# Patient Record
Sex: Female | Born: 1950 | Race: Black or African American | Hispanic: No | Marital: Single | Smoking: Former smoker
Health system: Southern US, Community
[De-identification: ages and names within clinical notes are randomized; demographics above are authoritative.]

## PROBLEM LIST (undated history)

## (undated) DIAGNOSIS — D509 Iron deficiency anemia, unspecified: Secondary | ICD-10-CM

## (undated) DIAGNOSIS — E7800 Pure hypercholesterolemia, unspecified: Secondary | ICD-10-CM

## (undated) HISTORY — PX: APPENDECTOMY: SHX54

## (undated) HISTORY — DX: Pure hypercholesterolemia, unspecified: E78.00

## (undated) HISTORY — PX: BREAST SURGERY: SHX581

## (undated) HISTORY — PX: ABDOMINAL HYSTERECTOMY: SHX81

---

## 2000-01-13 ENCOUNTER — Other Ambulatory Visit: Admission: RE | Admit: 2000-01-13 | Discharge: 2000-01-13 | Payer: Self-pay | Admitting: Internal Medicine

## 2016-03-02 ENCOUNTER — Other Ambulatory Visit: Payer: Self-pay | Admitting: Family Medicine

## 2016-03-02 DIAGNOSIS — Z1231 Encounter for screening mammogram for malignant neoplasm of breast: Secondary | ICD-10-CM

## 2016-03-17 ENCOUNTER — Ambulatory Visit
Admission: RE | Admit: 2016-03-17 | Discharge: 2016-03-17 | Disposition: A | Discharge status: Home / Self Care | Payer: Medicare Other | Source: Ambulatory Visit | Attending: Family Medicine | Admitting: Family Medicine

## 2016-03-17 DIAGNOSIS — Z1231 Encounter for screening mammogram for malignant neoplasm of breast: Secondary | ICD-10-CM

## 2017-03-09 ENCOUNTER — Other Ambulatory Visit: Payer: Self-pay | Admitting: Family Medicine

## 2017-03-09 DIAGNOSIS — E2839 Other primary ovarian failure: Secondary | ICD-10-CM

## 2017-03-09 DIAGNOSIS — Z1231 Encounter for screening mammogram for malignant neoplasm of breast: Secondary | ICD-10-CM

## 2017-03-31 ENCOUNTER — Ambulatory Visit
Admission: RE | Admit: 2017-03-31 | Discharge: 2017-03-31 | Disposition: A | Discharge status: Home / Self Care | Payer: Medicare Other | Source: Ambulatory Visit | Attending: Family Medicine | Admitting: Family Medicine

## 2017-03-31 DIAGNOSIS — E2839 Other primary ovarian failure: Secondary | ICD-10-CM

## 2017-04-05 ENCOUNTER — Other Ambulatory Visit: Payer: Self-pay | Admitting: Family Medicine

## 2017-04-05 DIAGNOSIS — E2839 Other primary ovarian failure: Secondary | ICD-10-CM

## 2018-01-17 ENCOUNTER — Encounter (HOSPITAL_COMMUNITY): Payer: Self-pay | Admitting: Student

## 2018-01-17 DIAGNOSIS — M542 Cervicalgia: Secondary | ICD-10-CM | POA: Insufficient documentation

## 2018-01-17 DIAGNOSIS — M545 Low back pain: Secondary | ICD-10-CM | POA: Diagnosis present

## 2018-01-17 DIAGNOSIS — Z5321 Procedure and treatment not carried out due to patient leaving prior to being seen by health care provider: Secondary | ICD-10-CM | POA: Diagnosis not present

## 2018-01-17 DIAGNOSIS — R51 Headache: Secondary | ICD-10-CM | POA: Diagnosis not present
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
## 2018-01-18 ENCOUNTER — Emergency Department (HOSPITAL_COMMUNITY)
Admission: EM | Admit: 2018-01-18 | Discharge: 2018-01-18 | Disposition: A | Discharge status: Home / Self Care | Payer: No Typology Code available for payment source | Attending: Emergency Medicine | Admitting: Emergency Medicine

## 2018-01-18 ENCOUNTER — Emergency Department (HOSPITAL_COMMUNITY)
Admission: EM | Admit: 2018-01-18 | Discharge: 2018-01-18 | Disposition: A | Discharge status: Home / Self Care | Payer: No Typology Code available for payment source | Source: Home / Self Care | Attending: Emergency Medicine | Admitting: Emergency Medicine

## 2018-01-18 ENCOUNTER — Encounter (HOSPITAL_COMMUNITY): Payer: Self-pay | Admitting: Emergency Medicine

## 2018-01-18 ENCOUNTER — Emergency Department (HOSPITAL_COMMUNITY)
Admission: EM | Admit: 2018-01-18 | Discharge: 2018-01-18 | Discharge status: Absent without leave | Payer: Medicare Other

## 2018-01-18 DIAGNOSIS — F17210 Nicotine dependence, cigarettes, uncomplicated: Secondary | ICD-10-CM | POA: Insufficient documentation

## 2018-01-18 DIAGNOSIS — M542 Cervicalgia: Secondary | ICD-10-CM | POA: Insufficient documentation

## 2018-01-18 DIAGNOSIS — M545 Low back pain: Secondary | ICD-10-CM

## 2018-01-18 MED ORDER — CYCLOBENZAPRINE HCL 10 MG PO TABS: 10.0000 mg | ORAL_TABLET | Freq: Two times a day (BID) | ORAL | 0 refills | Status: DC | PRN

## 2018-01-18 MED ORDER — IBUPROFEN 600 MG PO TABS: 600.0000 mg | ORAL_TABLET | Freq: Four times a day (QID) | ORAL | 0 refills | Status: DC | PRN
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
## 2018-01-18 NOTE — ED Notes (Signed)
Bed: WTR5 Expected date:  Expected time:  Means of arrival:  Comments: 

## 2018-01-18 NOTE — ED Notes (Addendum)
Pt. Called to room but no answer x 2.

## 2018-05-09 ENCOUNTER — Other Ambulatory Visit: Payer: Self-pay | Admitting: Family Medicine

## 2018-05-09 DIAGNOSIS — Z1231 Encounter for screening mammogram for malignant neoplasm of breast: Secondary | ICD-10-CM

## 2020-04-16 ENCOUNTER — Other Ambulatory Visit: Payer: Self-pay | Admitting: Family Medicine

## 2020-04-16 DIAGNOSIS — Z1231 Encounter for screening mammogram for malignant neoplasm of breast: Secondary | ICD-10-CM

## 2020-05-08 ENCOUNTER — Other Ambulatory Visit: Payer: Self-pay

## 2020-05-08 ENCOUNTER — Ambulatory Visit
Admission: RE | Admit: 2020-05-08 | Discharge: 2020-05-08 | Disposition: A | Discharge status: Home / Self Care | Payer: Medicare Other | Source: Ambulatory Visit | Attending: Family Medicine | Admitting: Family Medicine

## 2020-05-08 DIAGNOSIS — Z1231 Encounter for screening mammogram for malignant neoplasm of breast: Secondary | ICD-10-CM

## 2021-05-11 ENCOUNTER — Other Ambulatory Visit: Payer: Self-pay

## 2021-05-11 ENCOUNTER — Other Ambulatory Visit: Payer: Self-pay | Admitting: Family Medicine

## 2021-05-11 ENCOUNTER — Ambulatory Visit
Admission: RE | Admit: 2021-05-11 | Discharge: 2021-05-11 | Disposition: A | Discharge status: Home / Self Care | Payer: Medicare Other | Source: Ambulatory Visit | Attending: Family Medicine | Admitting: Family Medicine

## 2021-05-11 DIAGNOSIS — Z1231 Encounter for screening mammogram for malignant neoplasm of breast: Secondary | ICD-10-CM

## 2021-05-19 DIAGNOSIS — F17201 Nicotine dependence, unspecified, in remission: Secondary | ICD-10-CM | POA: Diagnosis not present

## 2021-05-19 DIAGNOSIS — Z0000 Encounter for general adult medical examination without abnormal findings: Secondary | ICD-10-CM | POA: Diagnosis not present

## 2021-05-19 DIAGNOSIS — Z13220 Encounter for screening for lipoid disorders: Secondary | ICD-10-CM | POA: Diagnosis not present

## 2021-05-19 DIAGNOSIS — D509 Iron deficiency anemia, unspecified: Secondary | ICD-10-CM | POA: Diagnosis not present

## 2021-05-19 DIAGNOSIS — Z1389 Encounter for screening for other disorder: Secondary | ICD-10-CM | POA: Diagnosis not present

## 2021-05-19 DIAGNOSIS — M8588 Other specified disorders of bone density and structure, other site: Secondary | ICD-10-CM | POA: Diagnosis not present

## 2021-05-19 DIAGNOSIS — Z136 Encounter for screening for cardiovascular disorders: Secondary | ICD-10-CM | POA: Diagnosis not present

## 2021-05-19 DIAGNOSIS — E782 Mixed hyperlipidemia: Secondary | ICD-10-CM | POA: Diagnosis not present

## 2021-07-07 DIAGNOSIS — Z780 Asymptomatic menopausal state: Secondary | ICD-10-CM | POA: Diagnosis not present

## 2021-07-19 ENCOUNTER — Emergency Department (HOSPITAL_COMMUNITY): Payer: Medicare Other

## 2021-07-19 ENCOUNTER — Other Ambulatory Visit: Payer: Self-pay

## 2021-07-19 ENCOUNTER — Emergency Department (HOSPITAL_COMMUNITY)
Admission: EM | Admit: 2021-07-19 | Discharge: 2021-07-20 | Disposition: A | Discharge status: Home / Self Care | Payer: Medicare Other | Attending: Emergency Medicine | Admitting: Emergency Medicine

## 2021-07-19 ENCOUNTER — Encounter (HOSPITAL_COMMUNITY): Payer: Self-pay

## 2021-07-19 DIAGNOSIS — Z041 Encounter for examination and observation following transport accident: Secondary | ICD-10-CM | POA: Diagnosis not present

## 2021-07-19 DIAGNOSIS — I700 Atherosclerosis of aorta: Secondary | ICD-10-CM | POA: Diagnosis not present

## 2021-07-19 DIAGNOSIS — M545 Low back pain, unspecified: Secondary | ICD-10-CM | POA: Diagnosis not present

## 2021-07-19 DIAGNOSIS — R600 Localized edema: Secondary | ICD-10-CM | POA: Diagnosis not present

## 2021-07-19 DIAGNOSIS — Z87891 Personal history of nicotine dependence: Secondary | ICD-10-CM | POA: Diagnosis not present

## 2021-07-19 DIAGNOSIS — M25562 Pain in left knee: Secondary | ICD-10-CM | POA: Insufficient documentation

## 2021-07-19 DIAGNOSIS — S0990XA Unspecified injury of head, initial encounter: Secondary | ICD-10-CM | POA: Diagnosis not present

## 2021-07-19 DIAGNOSIS — G319 Degenerative disease of nervous system, unspecified: Secondary | ICD-10-CM | POA: Diagnosis not present

## 2021-07-19 DIAGNOSIS — R0781 Pleurodynia: Secondary | ICD-10-CM | POA: Diagnosis not present

## 2021-07-19 DIAGNOSIS — K7689 Other specified diseases of liver: Secondary | ICD-10-CM | POA: Diagnosis not present

## 2021-07-19 DIAGNOSIS — M47812 Spondylosis without myelopathy or radiculopathy, cervical region: Secondary | ICD-10-CM | POA: Diagnosis not present

## 2021-07-19 DIAGNOSIS — S199XXA Unspecified injury of neck, initial encounter: Secondary | ICD-10-CM | POA: Diagnosis present

## 2021-07-19 DIAGNOSIS — M25561 Pain in right knee: Secondary | ICD-10-CM | POA: Insufficient documentation

## 2021-07-19 DIAGNOSIS — K8020 Calculus of gallbladder without cholecystitis without obstruction: Secondary | ICD-10-CM | POA: Diagnosis not present

## 2021-07-19 DIAGNOSIS — R1030 Lower abdominal pain, unspecified: Secondary | ICD-10-CM | POA: Insufficient documentation

## 2021-07-19 DIAGNOSIS — I2510 Atherosclerotic heart disease of native coronary artery without angina pectoris: Secondary | ICD-10-CM | POA: Diagnosis not present

## 2021-07-19 DIAGNOSIS — Y92410 Unspecified street and highway as the place of occurrence of the external cause: Secondary | ICD-10-CM | POA: Insufficient documentation

## 2021-07-19 HISTORY — DX: Iron deficiency anemia, unspecified: D50.9

## 2021-07-19 LAB — COMPREHENSIVE METABOLIC PANEL
ALT: 13 U/L (ref 0–44)
AST: 17 U/L (ref 15–41)
Albumin: 4.6 g/dL (ref 3.5–5.0)
Alkaline Phosphatase: 122 U/L (ref 38–126)
Anion gap: 5 (ref 5–15)
BUN: 18 mg/dL (ref 8–23)
CO2: 26 mmol/L (ref 22–32)
Calcium: 10.2 mg/dL (ref 8.9–10.3)
Chloride: 104 mmol/L (ref 98–111)
Creatinine, Ser: 0.9 mg/dL (ref 0.44–1.00)
GFR, Estimated: >60 mL/min (ref >60)
Glucose, Bld: 91 mg/dL (ref 70–99)
Potassium: 3.8 mmol/L (ref 3.5–5.1)
Sodium: 135 mmol/L (ref 135–145)
Total Bilirubin: 0.7 mg/dL (ref 0.3–1.2)
Total Protein: 8 g/dL (ref 6.5–8.1)

## 2021-07-19 LAB — CBC WITH DIFFERENTIAL/PLATELET
Abs Immature Granulocytes: 0.09 10*3/uL — ABNORMAL HIGH (ref 0.00–0.07)
Basophils Absolute: 0 10*3/uL (ref 0.0–0.1)
Basophils Relative: 0 %
Eosinophils Absolute: 0 10*3/uL (ref 0.0–0.5)
Eosinophils Relative: 1 %
HCT: 34.3 % — ABNORMAL LOW (ref 36.0–46.0)
Hemoglobin: 10.9 g/dL — ABNORMAL LOW (ref 12.0–15.0)
Immature Granulocytes: 2 %
Lymphocytes Relative: 25 %
Lymphs Abs: 1.2 10*3/uL (ref 0.7–4.0)
MCH: 29 pg (ref 26.0–34.0)
MCHC: 31.8 g/dL (ref 30.0–36.0)
MCV: 91.2 fL (ref 80.0–100.0)
Monocytes Absolute: 0.3 10*3/uL (ref 0.1–1.0)
Monocytes Relative: 5 %
Neutro Abs: 3.2 10*3/uL (ref 1.7–7.7)
Neutrophils Relative %: 67 %
Platelets: 144 10*3/uL — ABNORMAL LOW (ref 150–400)
RBC: 3.76 MIL/uL — ABNORMAL LOW (ref 3.87–5.11)
RDW: 18.4 % — ABNORMAL HIGH (ref 11.5–15.5)
WBC: 4.8 10*3/uL (ref 4.0–10.5)
nRBC: 2.3 % — ABNORMAL HIGH (ref 0.0–0.2)

## 2021-07-19 MED ORDER — IOHEXOL 350 MG/ML SOLN: 80.0000 mL | Freq: Once | INTRAVENOUS | Status: DC | PRN

## 2021-07-19 MED ORDER — ACETAMINOPHEN 325 MG PO TABS
650.0000 mg | ORAL_TABLET | Freq: Once | ORAL | Status: AC
  Administered 2021-07-19: 650 mg via ORAL
  Filled 2021-07-19: qty 2
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
## 2021-07-19 NOTE — ED Notes (Signed)
Entered room to introduce myself and start IV on patient. Patient states that she told staff that she did not want the contrast for the CT. PA made aware and will speak with patient.

## 2021-07-19 NOTE — Progress Notes (Signed)
CT technologist brought patient back to CT. The patient did not want an IV started for the contrasted CT and refused xray contrast for the exam. The patient was given information regarding the importance of the CT with contrast. The technologist call Sophia PA. The PA did not have time to speak with patient and stated to let the patient go back to the main lobby to wait until she could speak with her regarding the contrasted exam. The CT head without and cervical spine without was completed to not delay further care.
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
## 2021-07-20 ENCOUNTER — Emergency Department (HOSPITAL_COMMUNITY): Payer: Medicare Other

## 2021-07-20 DIAGNOSIS — K7689 Other specified diseases of liver: Secondary | ICD-10-CM | POA: Diagnosis not present

## 2021-07-20 DIAGNOSIS — I2510 Atherosclerotic heart disease of native coronary artery without angina pectoris: Secondary | ICD-10-CM | POA: Diagnosis not present

## 2021-07-20 DIAGNOSIS — I700 Atherosclerosis of aorta: Secondary | ICD-10-CM | POA: Diagnosis not present

## 2021-07-20 DIAGNOSIS — R600 Localized edema: Secondary | ICD-10-CM | POA: Diagnosis not present

## 2021-07-20 DIAGNOSIS — K8020 Calculus of gallbladder without cholecystitis without obstruction: Secondary | ICD-10-CM | POA: Diagnosis not present

## 2021-07-20 MED ORDER — IBUPROFEN 200 MG PO TABS
600.0000 mg | ORAL_TABLET | Freq: Once | ORAL | Status: AC
  Administered 2021-07-20: 600 mg via ORAL
  Filled 2021-07-20: qty 3

## 2021-07-20 MED ORDER — IBUPROFEN 600 MG PO TABS: 600.0000 mg | ORAL_TABLET | Freq: Four times a day (QID) | ORAL | 0 refills | Status: DC | PRN
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
## 2021-07-20 NOTE — Discharge Instructions (Signed)
As we discussed, there was no traumatic injury noted in the abdomen but there were some bony changes noted to the pelvis and the lumbar spine.  Please follow-up with your primary care doctor about this.  Labs and imaging reports attached on back. Continue Tylenol or Motrin as needed for pain. Return here for any new or acute changes.

## 2021-08-04 ENCOUNTER — Other Ambulatory Visit: Payer: Self-pay | Admitting: Family Medicine

## 2021-08-04 ENCOUNTER — Other Ambulatory Visit (HOSPITAL_COMMUNITY): Payer: Self-pay | Admitting: Family Medicine

## 2021-08-04 DIAGNOSIS — R9389 Abnormal findings on diagnostic imaging of other specified body structures: Secondary | ICD-10-CM

## 2021-09-03 DIAGNOSIS — D509 Iron deficiency anemia, unspecified: Secondary | ICD-10-CM | POA: Diagnosis not present

## 2021-10-13 DIAGNOSIS — D509 Iron deficiency anemia, unspecified: Secondary | ICD-10-CM | POA: Diagnosis not present
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
## 2022-06-17 DIAGNOSIS — E782 Mixed hyperlipidemia: Secondary | ICD-10-CM | POA: Diagnosis not present

## 2022-06-17 DIAGNOSIS — D509 Iron deficiency anemia, unspecified: Secondary | ICD-10-CM | POA: Diagnosis not present

## 2022-06-17 DIAGNOSIS — Z0000 Encounter for general adult medical examination without abnormal findings: Secondary | ICD-10-CM | POA: Diagnosis not present

## 2022-07-29 DIAGNOSIS — D509 Iron deficiency anemia, unspecified: Secondary | ICD-10-CM | POA: Diagnosis not present

## 2022-09-30 DIAGNOSIS — E7800 Pure hypercholesterolemia, unspecified: Secondary | ICD-10-CM | POA: Diagnosis not present

## 2022-12-16 DIAGNOSIS — E7800 Pure hypercholesterolemia, unspecified: Secondary | ICD-10-CM | POA: Diagnosis not present

## 2023-06-30 ENCOUNTER — Other Ambulatory Visit: Payer: Self-pay | Admitting: Physician Assistant

## 2023-06-30 DIAGNOSIS — Z1211 Encounter for screening for malignant neoplasm of colon: Secondary | ICD-10-CM | POA: Diagnosis not present

## 2023-06-30 DIAGNOSIS — R0989 Other specified symptoms and signs involving the circulatory and respiratory systems: Secondary | ICD-10-CM

## 2023-06-30 DIAGNOSIS — E782 Mixed hyperlipidemia: Secondary | ICD-10-CM | POA: Diagnosis not present

## 2023-06-30 DIAGNOSIS — D509 Iron deficiency anemia, unspecified: Secondary | ICD-10-CM | POA: Diagnosis not present

## 2023-06-30 DIAGNOSIS — Z1231 Encounter for screening mammogram for malignant neoplasm of breast: Secondary | ICD-10-CM | POA: Diagnosis not present

## 2023-06-30 DIAGNOSIS — D696 Thrombocytopenia, unspecified: Secondary | ICD-10-CM | POA: Diagnosis not present

## 2023-06-30 DIAGNOSIS — Z0000 Encounter for general adult medical examination without abnormal findings: Secondary | ICD-10-CM | POA: Diagnosis not present

## 2023-07-07 DIAGNOSIS — E860 Dehydration: Secondary | ICD-10-CM | POA: Diagnosis not present

## 2023-07-13 DIAGNOSIS — Z1211 Encounter for screening for malignant neoplasm of colon: Secondary | ICD-10-CM | POA: Diagnosis not present

## 2023-07-20 DIAGNOSIS — Z1231 Encounter for screening mammogram for malignant neoplasm of breast: Secondary | ICD-10-CM | POA: Diagnosis not present

## 2023-07-20 DIAGNOSIS — M8589 Other specified disorders of bone density and structure, multiple sites: Secondary | ICD-10-CM | POA: Diagnosis not present

## 2023-07-21 ENCOUNTER — Ambulatory Visit
Admission: RE | Admit: 2023-07-21 | Discharge: 2023-07-21 | Disposition: A | Discharge status: Home / Self Care | Payer: Medicare Other | Source: Ambulatory Visit | Attending: Physician Assistant | Admitting: Physician Assistant

## 2023-07-21 DIAGNOSIS — R0989 Other specified symptoms and signs involving the circulatory and respiratory systems: Secondary | ICD-10-CM | POA: Diagnosis not present

## 2023-11-08 DIAGNOSIS — E7800 Pure hypercholesterolemia, unspecified: Secondary | ICD-10-CM | POA: Diagnosis not present

## 2023-11-08 DIAGNOSIS — I6529 Occlusion and stenosis of unspecified carotid artery: Secondary | ICD-10-CM | POA: Diagnosis not present

## 2023-11-08 DIAGNOSIS — R030 Elevated blood-pressure reading, without diagnosis of hypertension: Secondary | ICD-10-CM | POA: Diagnosis not present

## 2023-11-08 DIAGNOSIS — D61818 Other pancytopenia: Secondary | ICD-10-CM | POA: Diagnosis not present

## 2024-01-11 ENCOUNTER — Other Ambulatory Visit: Payer: Self-pay

## 2024-01-11 ENCOUNTER — Emergency Department (HOSPITAL_COMMUNITY)
Admission: EM | Admit: 2024-01-11 | Discharge: 2024-01-11 | Disposition: A | Discharge status: Home / Self Care | Attending: Emergency Medicine | Admitting: Emergency Medicine

## 2024-01-11 ENCOUNTER — Emergency Department (HOSPITAL_COMMUNITY)

## 2024-01-11 ENCOUNTER — Encounter (HOSPITAL_COMMUNITY): Payer: Self-pay | Admitting: Emergency Medicine

## 2024-01-11 DIAGNOSIS — Z79899 Other long term (current) drug therapy: Secondary | ICD-10-CM | POA: Insufficient documentation

## 2024-01-11 DIAGNOSIS — R5600 Simple febrile convulsions: Secondary | ICD-10-CM | POA: Diagnosis not present

## 2024-01-11 DIAGNOSIS — I10 Essential (primary) hypertension: Secondary | ICD-10-CM | POA: Insufficient documentation

## 2024-01-11 DIAGNOSIS — R569 Unspecified convulsions: Secondary | ICD-10-CM

## 2024-01-11 LAB — URINALYSIS, ROUTINE W REFLEX MICROSCOPIC
Bilirubin Urine: NEGATIVE
Glucose, UA: NEGATIVE mg/dL
Hgb urine dipstick: NEGATIVE
Ketones, ur: NEGATIVE mg/dL
Leukocytes,Ua: NEGATIVE
Nitrite: NEGATIVE
Protein, ur: NEGATIVE mg/dL
Specific Gravity, Urine: 1.004 — ABNORMAL LOW (ref 1.005–1.030)
pH: 5 (ref 5.0–8.0)

## 2024-01-11 LAB — COMPREHENSIVE METABOLIC PANEL
ALT: 16 U/L (ref 0–44)
AST: 26 U/L (ref 15–41)
Albumin: 4.4 g/dL (ref 3.5–5.0)
Alkaline Phosphatase: 108 U/L (ref 38–126)
Anion gap: 7 (ref 5–15)
BUN: 14 mg/dL (ref 8–23)
CO2: 27 mmol/L (ref 22–32)
Calcium: 10.4 mg/dL — ABNORMAL HIGH (ref 8.9–10.3)
Chloride: 103 mmol/L (ref 98–111)
Creatinine, Ser: 0.88 mg/dL (ref 0.44–1.00)
GFR, Estimated: >60 mL/min (ref >60)
Glucose, Bld: 100 mg/dL — ABNORMAL HIGH (ref 70–99)
Potassium: 4.4 mmol/L (ref 3.5–5.1)
Sodium: 137 mmol/L (ref 135–145)
Total Bilirubin: 0.3 mg/dL (ref 0.0–1.2)
Total Protein: 7.7 g/dL (ref 6.5–8.1)

## 2024-01-11 LAB — CBC WITH DIFFERENTIAL/PLATELET
Abs Immature Granulocytes: 0.18 10*3/uL — ABNORMAL HIGH (ref 0.00–0.07)
Basophils Absolute: 0 10*3/uL (ref 0.0–0.1)
Basophils Relative: 1 %
Eosinophils Absolute: 0 10*3/uL (ref 0.0–0.5)
Eosinophils Relative: 1 %
HCT: 34.3 % — ABNORMAL LOW (ref 36.0–46.0)
Hemoglobin: 10.5 g/dL — ABNORMAL LOW (ref 12.0–15.0)
Immature Granulocytes: 4 %
Lymphocytes Relative: 35 %
Lymphs Abs: 1.6 10*3/uL (ref 0.7–4.0)
MCH: 29.1 pg (ref 26.0–34.0)
MCHC: 30.6 g/dL (ref 30.0–36.0)
MCV: 95 fL (ref 80.0–100.0)
Monocytes Absolute: 0.4 10*3/uL (ref 0.1–1.0)
Monocytes Relative: 8 %
Neutro Abs: 2.3 10*3/uL (ref 1.7–7.7)
Neutrophils Relative %: 51 %
Platelets: 126 10*3/uL — ABNORMAL LOW (ref 150–400)
RBC: 3.61 MIL/uL — ABNORMAL LOW (ref 3.87–5.11)
RDW: 17.7 % — ABNORMAL HIGH (ref 11.5–15.5)
WBC: 4.6 10*3/uL (ref 4.0–10.5)
nRBC: 5.3 % — ABNORMAL HIGH (ref 0.0–0.2)

## 2024-01-11 LAB — TROPONIN I (HIGH SENSITIVITY): Troponin I (High Sensitivity): 4 ng/L (ref <18)

## 2024-01-11 NOTE — Discharge Instructions (Signed)
 Concern for seizure-like activity.  We recommended that you be admitted to the hospital for this.  I strongly encourage you not to drive or do any dangerous activities while you might harm yourself or others if you had another episode.  This includes swimming by herself or driving.  Please strongly consider returning especially if you have more events.  Follow-up with your primary care doctor and neurology.

## 2024-01-11 NOTE — ED Notes (Signed)
 ED Provider at bedside.

## 2024-01-11 NOTE — ED Triage Notes (Signed)
 Pt states she has been taking Atorvastatin for 2 years, but recently had dose increased, and she thinks this is causing a reaction. Pt daughter states pt has had several focal seizures over the past few months, and episodes of confusion. Pt denies any prior history of this.

## 2024-01-11 NOTE — ED Provider Triage Note (Signed)
 Emergency Medicine Provider Triage Evaluation Note  GRACIELLA ARMENT , a 73 y.o. female  was evaluated in triage.  Pt complains of syncopal event/seizure like activity? Reports it is a "focal seizure", but provides limited description. Daughter gives the majority of the history. She reports it has been witness 8 times in 3 years. She had an episode yesterday and earlier today. Yesterday the patient reports she had tingling feeling in her head. Today she had no prodrome. Daughter reports that she was standing and started to slump over, but her legs were rigid. No falls. Did not hit her head. She has not brought these up at any of her PCP visits. Daughter reprots the episodes last between 3-5 minutes. Denies any chest pain, palpitations, HA, toruble walking or talking, or change in vision. No previous h/o seizure.   Review of Systems  Positive:  Negative:   Physical Exam  BP (!) 181/77 (BP Location: Left Arm)   Pulse 80   Temp 98.7 F (37.1 C) (Oral)   Resp 18   SpO2 100%  Gen:   Awake, no distress   Resp:  Normal effort  MSK:   Moves all extremities without difficulty  Other:  Cranial nerves grossly intact. Moving all extremities.   Medical Decision Making  Medically screening exam initiated at 5:41 PM.  Appropriate orders placed.  CHERYLEE RAWLINSON was informed that the remainder of the evaluation will be completed by another provider, this initial triage assessment does not replace that evaluation, and the importance of remaining in the ED until their evaluation is complete.  CT and labs ordered   Achille Rich, [place] 01/11/24 1746

## 2024-01-11 NOTE — ED Provider Notes (Signed)
 [facility] AT [facility] Provider Note   CSN: [id number] Arrival date & time: 01/11/24  1641     History  Chief Complaint  Patient presents with   Seizures    Kellie Duran is a 73 y.o. female.  Patient here with seizure-like activity.  Sounds like maybe 4 events over the last several months but 1 today and 1 yesterday.  Patient lives with daughter who is at the bedside.  She describes an episode yesterday where patient was staring off stiffened up was unresponsive lasted a few minutes had some confusion afterwards and then was back to her baseline.  Had another episode again today.  Had an episode similar in December as well.  Patient is at her baseline now.  She has no chest pain shortness of breath weakness numbness tingling.  She has a history of hypertension high cholesterol.  Family concern for seizures.  The history is provided by the patient and a relative.       Home Medications Prior to Admission medications   Medication Sig Start Date End Date Taking? Authorizing Provider  cyclobenzaprine (FLEXERIL) 10 MG tablet Take 1 tablet (10 mg total) by mouth 2 (two) times daily as needed for muscle spasms. 01/18/18   Fayrene Helper, PA-C  ibuprofen (ADVIL) 600 MG tablet Take 1 tablet (600 mg total) by mouth every 6 (six) hours as needed. 07/20/21   Garlon Hatchet, PA-C      Allergies    Patient has no known allergies.    Review of Systems   Review of Systems  Physical Exam Updated Vital Signs BP (!) 156/58   Pulse 67   Temp 97.8 F (36.6 C) (Oral)   Resp 18   SpO2 99%  Physical Exam Vitals and nursing note reviewed.  Constitutional:      General: She is not in acute distress.    Appearance: She is well-developed. She is not ill-appearing.  HENT:     Head: Normocephalic and atraumatic.     Nose: Nose normal.     Mouth/Throat:     Mouth: Mucous membranes are moist.  Eyes:     Extraocular Movements: Extraocular movements intact.      Conjunctiva/sclera: Conjunctivae normal.     Pupils: Pupils are equal, round, and reactive to light.  Cardiovascular:     Rate and Rhythm: Normal rate and regular rhythm.     Pulses: Normal pulses.     Heart sounds: Normal heart sounds. No murmur heard. Pulmonary:     Effort: Pulmonary effort is normal. No respiratory distress.     Breath sounds: Normal breath sounds.  Abdominal:     Palpations: Abdomen is soft.     Tenderness: There is no abdominal tenderness.  Musculoskeletal:        General: No swelling.     Cervical back: Normal range of motion and neck supple.  Skin:    General: Skin is warm and dry.     Capillary Refill: Capillary refill takes less than 2 seconds.  Neurological:     General: No focal deficit present.     Mental Status: She is alert and oriented to person, place, and time.     Cranial Nerves: No cranial nerve deficit.     Sensory: No sensory deficit.     Motor: No weakness.     Coordination: Coordination normal.     Gait: Gait normal.     Comments: 5+ out of 5 strength all, normal  sensation, no drift, normal finger-to-nose finger, normal speech  Psychiatric:        Mood and Affect: Mood normal.     ED Results / Procedures / Treatments   Labs (all labs ordered are listed, but only abnormal results are displayed) Labs Reviewed  CBC WITH DIFFERENTIAL/PLATELET - Abnormal; Notable for the following components:      Result Value   RBC 3.61 (*)    Hemoglobin 10.5 (*)    HCT 34.3 (*)    RDW 17.7 (*)    Platelets 126 (*)    nRBC 5.3 (*)    Abs Immature Granulocytes 0.18 (*)    All other components within normal limits  COMPREHENSIVE METABOLIC PANEL - Abnormal; Notable for the following components:   Glucose, Bld 100 (*)    Calcium 10.4 (*)    All other components within normal limits  URINALYSIS, ROUTINE W REFLEX MICROSCOPIC - Abnormal; Notable for the following components:   Color, Urine STRAW (*)    Specific Gravity, Urine 1.004 (*)    All other  components within normal limits  TROPONIN I (HIGH SENSITIVITY)    EKG EKG Interpretation Date/Time:  Wednesday January 11 2024 18:02:57 EDT Ventricular Rate:  71 PR Interval:  144 QRS Duration:  142 QT Interval:  364 QTC Calculation: 396 R Axis:   38  Text Interpretation: Sinus rhythm Nonspecific intraventricular conduction delay Confirmed by Virgina Norfolk [phone]) on 01/11/2024 7:22:02 PM  Radiology CT Head Wo Contrast Result Date: 01/11/2024 CLINICAL DATA:  Seizure, new onset, no history of trauma. EXAM: CT HEAD WITHOUT CONTRAST TECHNIQUE: Contiguous axial images were obtained from the base of the skull through the vertex without intravenous contrast. RADIATION DOSE REDUCTION: This exam was performed according to the departmental dose-optimization program which includes automated exposure control, adjustment of the mA and/or kV according to patient size and/or use of iterative reconstruction technique. COMPARISON:  07/19/2021 FINDINGS: Brain: No evidence of acute infarction, hemorrhage, hydrocephalus, extra-axial collection or mass lesion/mass effect. Vascular: No hyperdense vessel or unexpected calcification. Skull: Heterogeneous calvarium diffusely, nonspecific but stable from 2022. Sinuses/Orbits: No acute finding. IMPRESSION: Unremarkable appearance of the brain. No correlate for seizure history. Electronically Signed   By: Tiburcio Pea M.D.   On: 01/11/2024 19:45    Procedures Procedures    Medications Ordered in ED Medications - No data to display  ED Course/ Medical Decision Making/ A&P                                 Medical Decision Making  [name] is here with seizure-like activity.  Normal vitals.  No fever.  History of high cholesterol.  Neurologically she is intact back at her baseline.  Sounds like family member states she had an episode of staring off with stiffening unresponsiveness that lasted about 2 to 3 minutes with a episode of confusion afterwards that  resolved.  No falls.  This happened today and yesterday may be back in December.  Ultimately she appears well.  She has no pain or complaints.  No chest pain or shortness of breath.  Differential diagnosis could be seizure could be vasovagal event could be cardiac arrhythmia.  We got CBC CMP troponin head CT EKG which per my review and interpretation are unremarkable.  No significant leukocytosis anemia electrolyte abnormality.  Head CT was unremarkable.  EKG shows sinus rhythm.  No ischemic changes.  Ultimately I talked with neurology  Dr. Derry Lory and he did recommend continuous EEG and admission and further cardiac evaluation as well.  I talked extensively with the patient and her family members at the bedside about this but ultimately patient refused admission.  Family understands this but she has capacity make this decision.  She left AMA.  She understands risks and benefits.  She has capacity make this decision.  I strongly encouraged her to not drive or do any dangerous activities including swimming alone or bathing alone and seizure precautions were given.  I did do an ambulatory referral to neurology but I did strongly encourage her to return for evaluation if she changed her mind or if events keep happening.  Is possible that this could be a cardiac process and cardiac arrhythmias as well and we discussed this.  Ultimately she decided to leave AMA.  I recommend follow-up with her primary care doctor.  Patient left AMA.  This chart was dictated using voice recognition software.  Despite best efforts to proofread,  errors can occur which can change the documentation meaning.         Final Clinical Impression(s) / ED Diagnoses Final diagnoses:  Seizure-like activity (HCC)    Rx / DC Orders ED Discharge Orders          Ordered    Ambulatory referral to Neurology       Comments: An appointment is requested in approximately: 1 week   01/11/24 2032              Virgina Norfolk,  DO 01/11/24 2036

## 2024-01-16 ENCOUNTER — Ambulatory Visit: Admitting: Neurology

## 2024-01-16 ENCOUNTER — Encounter: Payer: Self-pay | Admitting: Neurology

## 2024-01-16 VITALS — BP 138/70 | HR 76 | Ht 64.0 in | Wt 159.0 lb

## 2024-01-16 DIAGNOSIS — R569 Unspecified convulsions: Secondary | ICD-10-CM | POA: Diagnosis not present
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
## 2024-01-16 NOTE — Patient Instructions (Signed)
 Routine EEG If normal, we will complete a 3-day ambulatory EEG If any abnormality on EEG, will likely start antiseizure medication Continue to follow with PCP Please contact me if you do have another event Return if worse or any other concerns.

## 2024-01-17 DIAGNOSIS — D61818 Other pancytopenia: Secondary | ICD-10-CM | POA: Diagnosis not present

## 2024-01-17 DIAGNOSIS — R7301 Impaired fasting glucose: Secondary | ICD-10-CM | POA: Diagnosis not present

## 2024-01-17 DIAGNOSIS — R569 Unspecified convulsions: Secondary | ICD-10-CM | POA: Diagnosis not present

## 2024-01-17 DIAGNOSIS — E559 Vitamin D deficiency, unspecified: Secondary | ICD-10-CM | POA: Diagnosis not present

## 2024-01-19 ENCOUNTER — Ambulatory Visit: Admitting: *Deleted

## 2024-01-19 DIAGNOSIS — R569 Unspecified convulsions: Secondary | ICD-10-CM

## 2024-01-19 NOTE — Procedures (Signed)
   History:  73 year old woman with seizure like activity   EEG classification:  Awake and asleep  Duration: 27 minutes   Technical aspects: This EEG study was done with scalp electrodes positioned according to the 10-20 International system of electrode placement. Electrical activity was reviewed with band pass filter of 1-70Hz , sensitivity of 7 uV/mm, display speed of 64mm/sec with a 60Hz  notched filter applied as appropriate. EEG data were recorded continuously and digitally stored.   Description of the recording: The background rhythms of this recording consists of a fairly well modulated medium amplitude background activity of 9 Hz. As the record progresses, the patient initially is in the waking state, but appears to enter the early stage II sleep during the recording, with rudimentary sleep spindles and vertex sharp wave activity seen. During the wakeful state, photic stimulation was performed, and no abnormal responses were seen. Hyperventilation was also performed, no abnormal response seen. No epileptiform discharges seen during this recording. There was no focal slowing.   Abnormality: None   Impression: This is a normal awake and sleep EEG. No evidence of interictal epileptiform discharges. Normal EEGs, however, do not rule out epilepsy.    Windell Norfolk, MD [facility]

## 2024-01-22 ENCOUNTER — Encounter: Payer: Self-pay | Admitting: Neurology

## 2024-01-22 ENCOUNTER — Other Ambulatory Visit: Payer: Self-pay | Admitting: Neurology

## 2024-01-22 DIAGNOSIS — R569 Unspecified convulsions: Secondary | ICD-10-CM

## 2024-01-23 ENCOUNTER — Other Ambulatory Visit: Payer: Self-pay | Admitting: Neurology

## 2024-01-24 ENCOUNTER — Telehealth: Payer: Self-pay

## 2024-01-24 NOTE — Telephone Encounter (Signed)
 Marland Kitchen

## 2024-07-19 DIAGNOSIS — Z79899 Other long term (current) drug therapy: Secondary | ICD-10-CM | POA: Diagnosis not present

## 2024-07-19 DIAGNOSIS — Z0000 Encounter for general adult medical examination without abnormal findings: Secondary | ICD-10-CM | POA: Diagnosis not present

## 2024-07-19 DIAGNOSIS — I6521 Occlusion and stenosis of right carotid artery: Secondary | ICD-10-CM | POA: Diagnosis not present

## 2024-07-19 DIAGNOSIS — R030 Elevated blood-pressure reading, without diagnosis of hypertension: Secondary | ICD-10-CM | POA: Diagnosis not present

## 2024-07-19 DIAGNOSIS — R799 Abnormal finding of blood chemistry, unspecified: Secondary | ICD-10-CM | POA: Diagnosis not present

## 2024-07-19 DIAGNOSIS — D649 Anemia, unspecified: Secondary | ICD-10-CM | POA: Diagnosis not present

## 2024-07-19 DIAGNOSIS — R944 Abnormal results of kidney function studies: Secondary | ICD-10-CM | POA: Diagnosis not present

## 2024-07-19 DIAGNOSIS — E782 Mixed hyperlipidemia: Secondary | ICD-10-CM | POA: Diagnosis not present
